# Patient Record
Sex: Female | Born: 1950 | Race: White | Hispanic: No | Marital: Married
Health system: Southern US, Community
[De-identification: ages and names within clinical notes are randomized; demographics above are authoritative.]

---

## 1997-10-23 ENCOUNTER — Other Ambulatory Visit: Admission: RE | Admit: 1997-10-23 | Discharge: 1997-10-23 | Payer: Self-pay | Admitting: Family Medicine

## 2003-01-10 ENCOUNTER — Encounter: Admission: RE | Admit: 2003-01-10 | Discharge: 2003-01-10 | Payer: Self-pay | Admitting: Family Medicine

## 2003-01-10 ENCOUNTER — Encounter: Payer: Self-pay | Admitting: Family Medicine

## 2004-02-06 ENCOUNTER — Encounter: Admission: RE | Admit: 2004-02-06 | Discharge: 2004-02-06 | Payer: Self-pay | Admitting: Family Medicine

## 2004-05-28 ENCOUNTER — Ambulatory Visit (HOSPITAL_COMMUNITY): Admission: RE | Admit: 2004-05-28 | Discharge: 2004-05-29 | Payer: Self-pay | Admitting: Surgery

## 2005-11-03 ENCOUNTER — Emergency Department (HOSPITAL_COMMUNITY): Admission: EM | Admit: 2005-11-03 | Discharge: 2005-11-03 | Payer: Self-pay | Admitting: Emergency Medicine

## 2006-03-23 ENCOUNTER — Other Ambulatory Visit: Admission: RE | Admit: 2006-03-23 | Discharge: 2006-03-23 | Payer: Self-pay | Admitting: Family Medicine

## 2010-12-02 ENCOUNTER — Other Ambulatory Visit: Payer: Self-pay | Admitting: Dermatology

## 2015-12-11 ENCOUNTER — Other Ambulatory Visit (HOSPITAL_COMMUNITY)
Admission: RE | Admit: 2015-12-11 | Discharge: 2015-12-11 | Disposition: A | Discharge status: Home / Self Care | Payer: Medicare Other | Source: Ambulatory Visit | Attending: Family Medicine | Admitting: Family Medicine

## 2015-12-11 ENCOUNTER — Other Ambulatory Visit: Payer: Self-pay | Admitting: Family Medicine

## 2015-12-11 DIAGNOSIS — Z124 Encounter for screening for malignant neoplasm of cervix: Secondary | ICD-10-CM | POA: Insufficient documentation

## 2015-12-12 LAB — CYTOLOGY - PAP

## 2015-12-15 ENCOUNTER — Other Ambulatory Visit: Payer: Self-pay | Admitting: Family Medicine

## 2015-12-15 DIAGNOSIS — Z1231 Encounter for screening mammogram for malignant neoplasm of breast: Secondary | ICD-10-CM

## 2015-12-29 ENCOUNTER — Ambulatory Visit
Admission: RE | Admit: 2015-12-29 | Discharge: 2015-12-29 | Disposition: A | Discharge status: Home / Self Care | Payer: Medicare Other | Source: Ambulatory Visit | Attending: Family Medicine | Admitting: Family Medicine

## 2015-12-29 DIAGNOSIS — Z1231 Encounter for screening mammogram for malignant neoplasm of breast: Secondary | ICD-10-CM

## 2015-12-30 ENCOUNTER — Other Ambulatory Visit: Payer: Self-pay | Admitting: Family Medicine

## 2015-12-30 DIAGNOSIS — R928 Other abnormal and inconclusive findings on diagnostic imaging of breast: Secondary | ICD-10-CM

## 2016-01-07 ENCOUNTER — Ambulatory Visit
Admission: RE | Admit: 2016-01-07 | Discharge: 2016-01-07 | Disposition: A | Discharge status: Home / Self Care | Payer: Medicare Other | Source: Ambulatory Visit | Attending: Family Medicine | Admitting: Family Medicine

## 2016-01-07 DIAGNOSIS — R928 Other abnormal and inconclusive findings on diagnostic imaging of breast: Secondary | ICD-10-CM

## 2017-01-12 ENCOUNTER — Other Ambulatory Visit: Payer: Self-pay | Admitting: Family Medicine

## 2017-01-12 DIAGNOSIS — R921 Mammographic calcification found on diagnostic imaging of breast: Secondary | ICD-10-CM

## 2017-01-21 ENCOUNTER — Ambulatory Visit
Admission: RE | Admit: 2017-01-21 | Discharge: 2017-01-21 | Disposition: A | Discharge status: Home / Self Care | Payer: Medicare Other | Source: Ambulatory Visit | Attending: Family Medicine | Admitting: Family Medicine

## 2017-01-21 DIAGNOSIS — R921 Mammographic calcification found on diagnostic imaging of breast: Secondary | ICD-10-CM

## 2017-12-15 ENCOUNTER — Other Ambulatory Visit: Payer: Self-pay | Admitting: Family Medicine

## 2017-12-15 DIAGNOSIS — Z1231 Encounter for screening mammogram for malignant neoplasm of breast: Secondary | ICD-10-CM

## 2018-01-23 ENCOUNTER — Ambulatory Visit
Admission: RE | Admit: 2018-01-23 | Discharge: 2018-01-23 | Disposition: A | Discharge status: Home / Self Care | Payer: Medicare Other | Source: Ambulatory Visit | Attending: Family Medicine | Admitting: Family Medicine

## 2018-01-23 DIAGNOSIS — Z1231 Encounter for screening mammogram for malignant neoplasm of breast: Secondary | ICD-10-CM

## 2018-01-24 ENCOUNTER — Other Ambulatory Visit: Payer: Self-pay | Admitting: Family Medicine

## 2018-01-24 DIAGNOSIS — R928 Other abnormal and inconclusive findings on diagnostic imaging of breast: Secondary | ICD-10-CM

## 2018-01-31 ENCOUNTER — Ambulatory Visit
Admission: RE | Admit: 2018-01-31 | Discharge: 2018-01-31 | Disposition: A | Discharge status: Home / Self Care | Payer: Medicare Other | Source: Ambulatory Visit | Attending: Family Medicine | Admitting: Family Medicine

## 2018-01-31 DIAGNOSIS — R928 Other abnormal and inconclusive findings on diagnostic imaging of breast: Secondary | ICD-10-CM

## 2019-06-15 ENCOUNTER — Ambulatory Visit: Payer: Medicare Other

## 2019-06-21 ENCOUNTER — Ambulatory Visit: Payer: Medicare PPO | Attending: Internal Medicine

## 2019-06-21 DIAGNOSIS — Z23 Encounter for immunization: Secondary | ICD-10-CM | POA: Insufficient documentation

## 2019-06-21 NOTE — Progress Notes (Signed)
   Covid-19 Vaccination Clinic  Name:  JESICCA DIPIERRO    MRN: [id number] DOB: 1950-06-26  06/21/2019  Ms. Raether was observed post Covid-19 immunization for 15 minutes without incidence. She was provided with Vaccine Information Sheet and instruction to access the V-Safe system.   Ms. Martello was instructed to call 911 with any severe reactions post vaccine: Marland Kitchen Difficulty breathing  . Swelling of your face and throat  . A fast heartbeat  . A bad rash all over your body  . Dizziness and weakness    Immunizations Administered    Name Date Dose VIS Date Route   Pfizer COVID-19 Vaccine 06/21/2019  1:40 PM 0.3 mL 04/27/2019 Intramuscular   Manufacturer: [employer], [employer]   Lot: [id number]   NDC: [address]-9

## 2019-07-02 ENCOUNTER — Ambulatory Visit: Payer: Medicare Other

## 2019-07-16 ENCOUNTER — Ambulatory Visit: Payer: Medicare PPO | Attending: Internal Medicine

## 2019-07-16 DIAGNOSIS — Z23 Encounter for immunization: Secondary | ICD-10-CM | POA: Insufficient documentation

## 2019-07-16 NOTE — Progress Notes (Signed)
   Covid-19 Vaccination Clinic  Name:  Amy Sellers    MRN: [id number] DOB: [birth year]  07/16/2019  Amy Sellers was observed post Covid-19 immunization for 15 minutes without incidence. She was provided with Vaccine Information Sheet and instruction to access the V-Safe system.   Amy Sellers was instructed to call 911 with any severe reactions post vaccine: Marland Kitchen Difficulty breathing  . Swelling of your face and throat  . A fast heartbeat  . A bad rash all over your body  . Dizziness and weakness    Immunizations Administered    Name Date Dose VIS Date Route   Pfizer COVID-19 Vaccine 07/16/2019  4:17 PM 0.3 mL 04/27/2019 Intramuscular   Manufacturer: [employer], [employer]   Lot: [id number]   NDC: [address]-6

## 2019-09-26 IMAGING — MG DIGITAL DIAGNOSTIC UNILATERAL LEFT MAMMOGRAM WITH TOMO AND CAD
6 series · 6 of 18 positions shown · non-contrast
Comparison: Previous exam(s).

CLINICAL DATA: 67-year-old female recalled from screening mammogram
dated 01/23/2018 for a possible left breast asymmetry.

EXAM:
DIGITAL DIAGNOSTIC LEFT MAMMOGRAM WITH CAD AND TOMO
ULTRASOUND LEFT BREAST

[L CC synth-2D (1 of 2)]
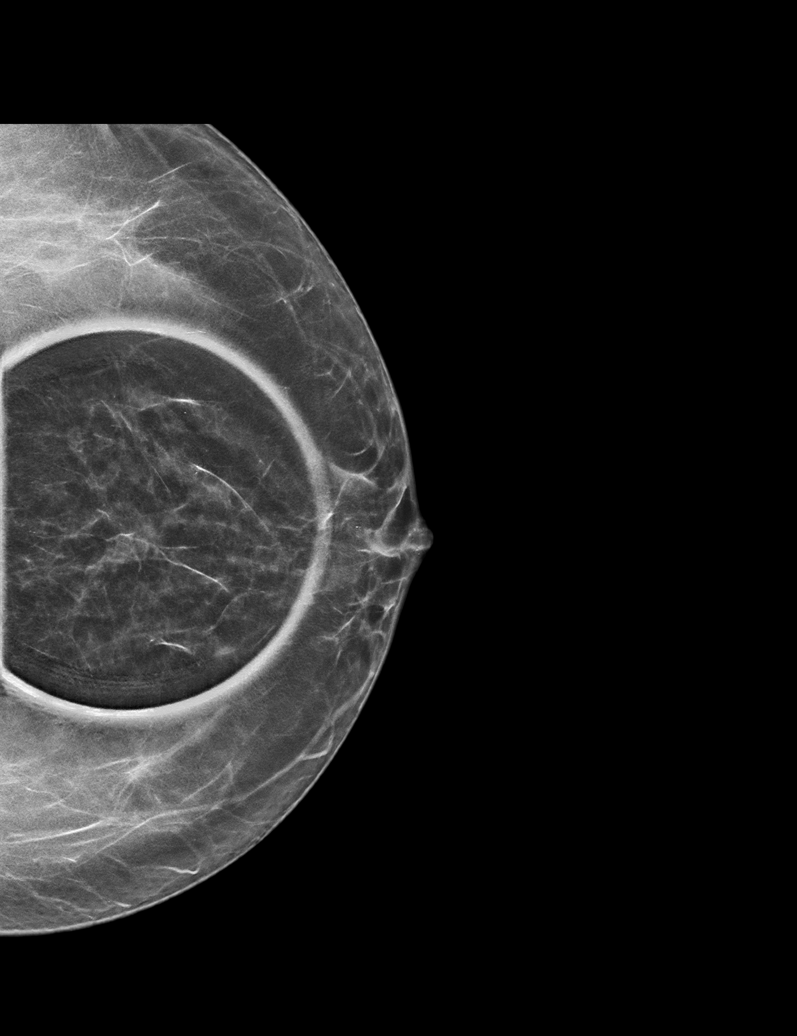

[L MLO synth-2D]
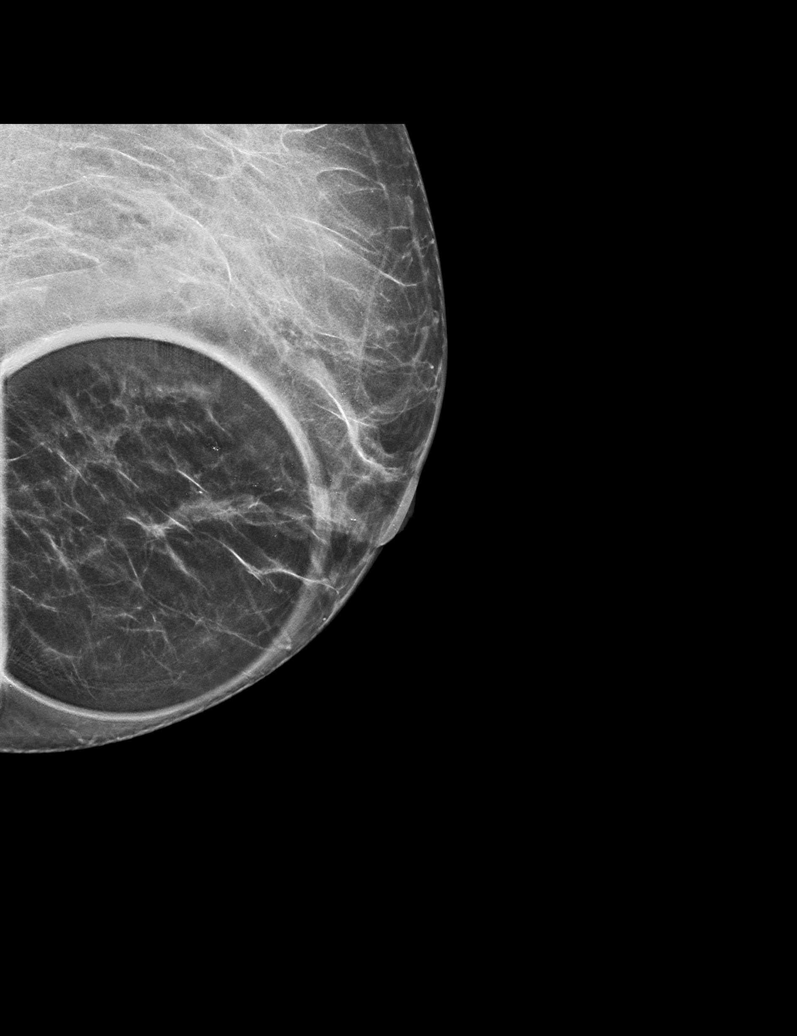

[L CC synth-2D (2 of 2)]
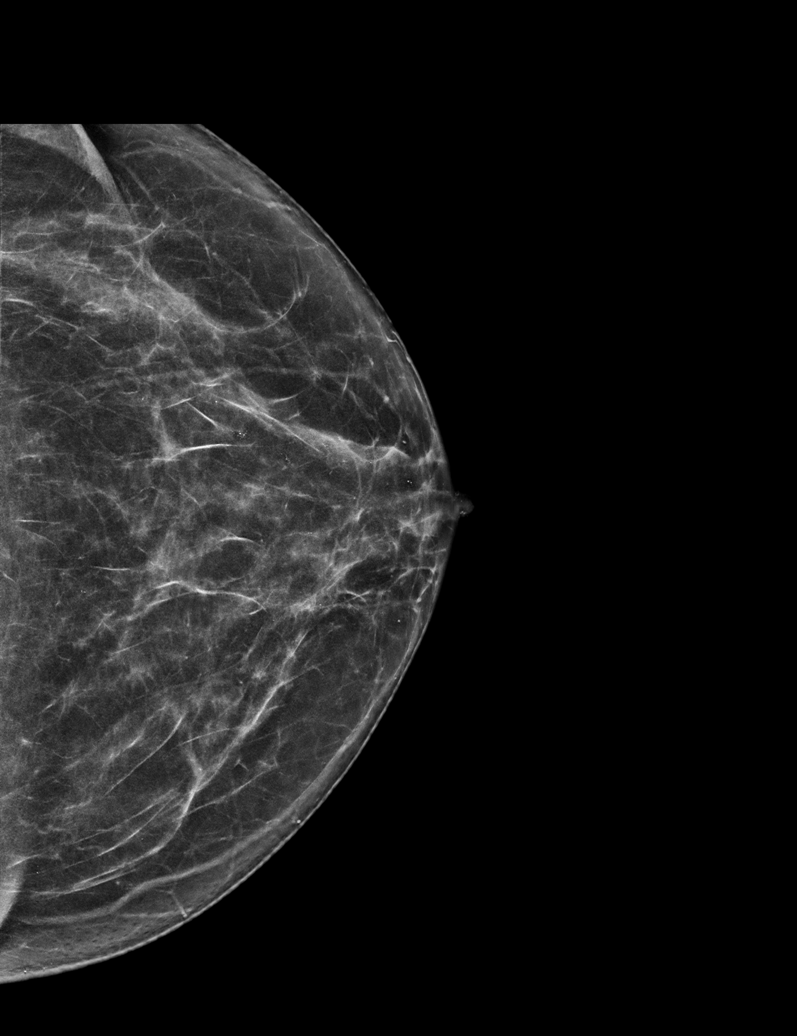

[L CC tomo (1 of 2) · tomo slice 27/53.0]
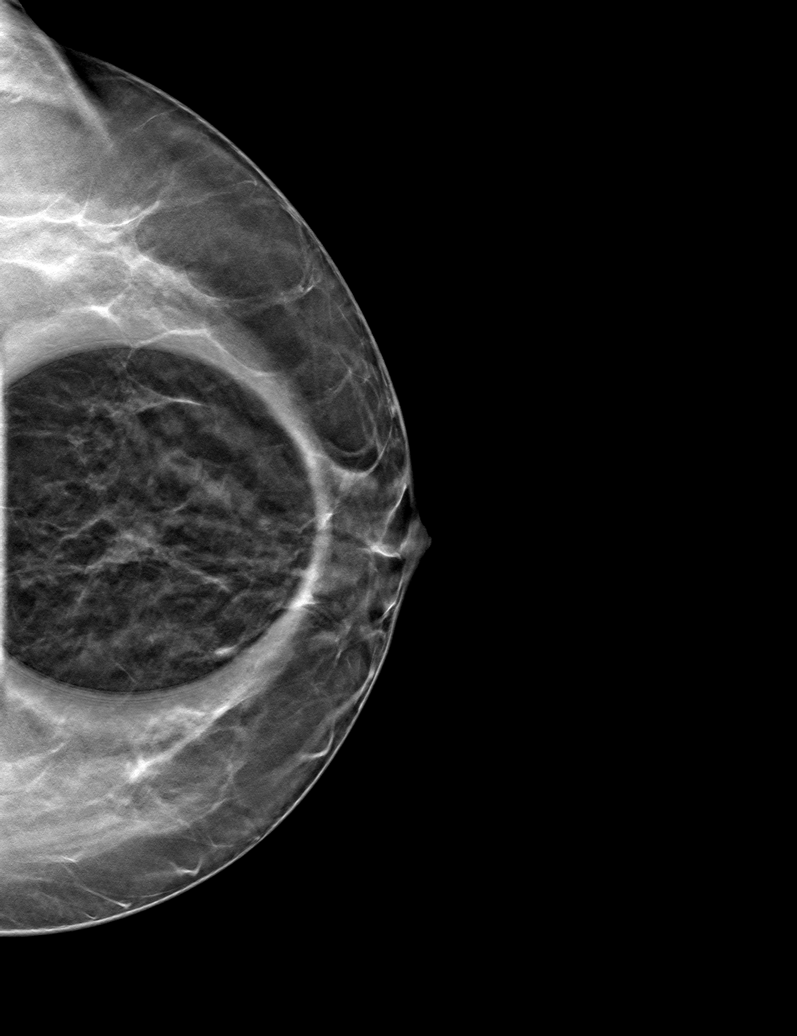

[L CC tomo (2 of 2) · tomo slice 31/62.0]
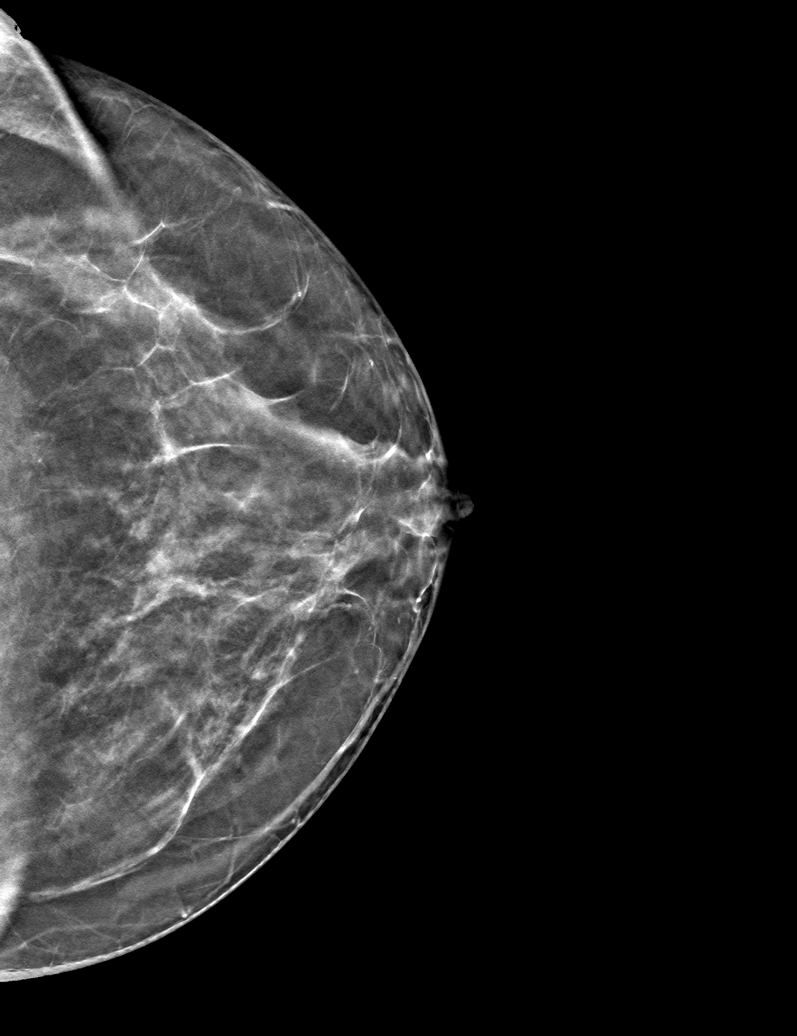

[L MLO tomo · tomo slice 25/48.0]
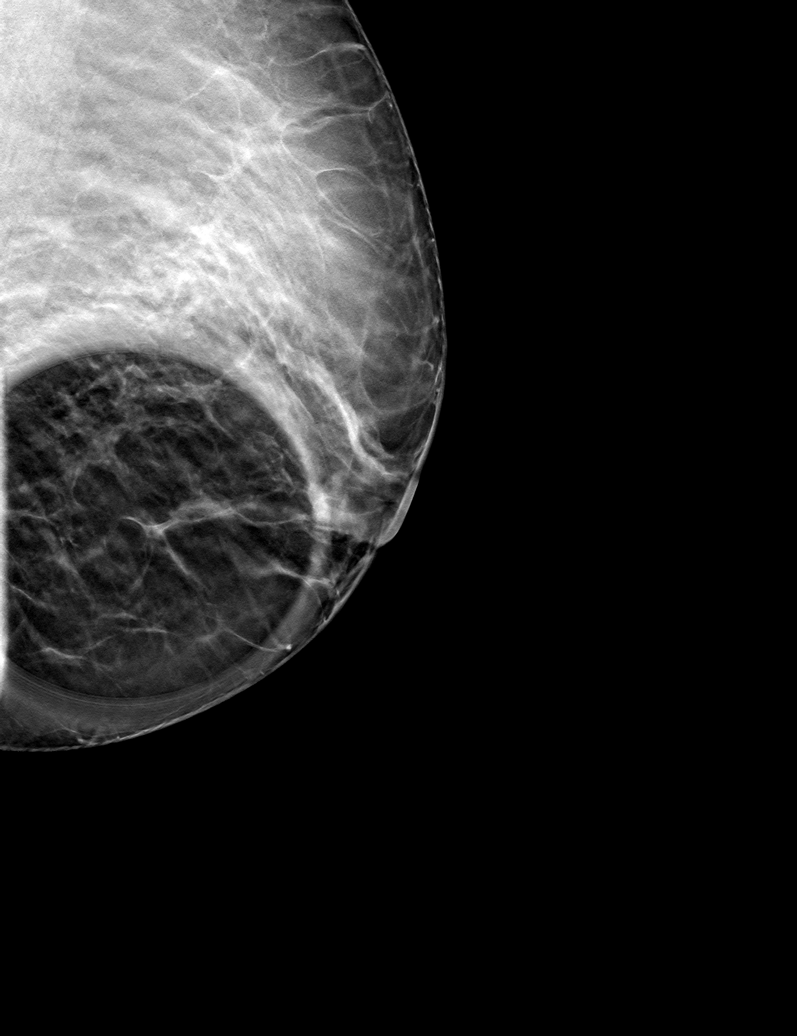

[6 of 18 positions shown; findings below may reference images not displayed]

ACR Breast Density Category c: The breast tissue is heterogeneously
dense, which may obscure small masses.
FINDINGS: Previously described, possible asymmetry in the inferior slightly
medial left breast at middle depth resolves into well dispersed
fatty and fibroglandular tissue on today's additional views. A
precautionary ultrasound was performed.

Mammographic images were processed with CAD.

Targeted ultrasound is performed, showing normal fibroglandular
tissue without focal or suspicious sonographic abnormality.
Evaluation of the entire lower inner quadrant of the left breast was
performed. Numerous minimally dilated ducts are incidentally noted
in the retroareolar region. They demonstrate no suspicious features.
IMPRESSION: No persistent mammographic or sonographic abnormality corresponding
with the screening mammographic finding.

RECOMMENDATION:
Screening mammogram in one year.(Code:MQ-[id number])

I have discussed the findings and recommendations with the patient.
Results were also provided in writing at the conclusion of the
visit. If applicable, a reminder letter will be sent to the patient
regarding the next appointment.

BI-RADS CATEGORY  2: Benign.

## 2020-05-06 DIAGNOSIS — M9903 Segmental and somatic dysfunction of lumbar region: Secondary | ICD-10-CM | POA: Diagnosis not present

## 2020-05-06 DIAGNOSIS — M47816 Spondylosis without myelopathy or radiculopathy, lumbar region: Secondary | ICD-10-CM | POA: Diagnosis not present

## 2020-05-07 DIAGNOSIS — M47816 Spondylosis without myelopathy or radiculopathy, lumbar region: Secondary | ICD-10-CM | POA: Diagnosis not present

## 2020-05-07 DIAGNOSIS — M9903 Segmental and somatic dysfunction of lumbar region: Secondary | ICD-10-CM | POA: Diagnosis not present

## 2021-02-04 DIAGNOSIS — M7662 Achilles tendinitis, left leg: Secondary | ICD-10-CM | POA: Diagnosis not present

## 2021-03-26 DIAGNOSIS — L814 Other melanin hyperpigmentation: Secondary | ICD-10-CM | POA: Diagnosis not present

## 2021-03-26 DIAGNOSIS — L821 Other seborrheic keratosis: Secondary | ICD-10-CM | POA: Diagnosis not present

## 2021-03-26 DIAGNOSIS — L820 Inflamed seborrheic keratosis: Secondary | ICD-10-CM | POA: Diagnosis not present

## 2021-03-26 DIAGNOSIS — D485 Neoplasm of uncertain behavior of skin: Secondary | ICD-10-CM | POA: Diagnosis not present

## 2021-09-11 DIAGNOSIS — M5451 Vertebrogenic low back pain: Secondary | ICD-10-CM | POA: Diagnosis not present

## 2021-09-21 DIAGNOSIS — M47816 Spondylosis without myelopathy or radiculopathy, lumbar region: Secondary | ICD-10-CM | POA: Diagnosis not present

## 2021-11-04 DIAGNOSIS — M47816 Spondylosis without myelopathy or radiculopathy, lumbar region: Secondary | ICD-10-CM | POA: Diagnosis not present

## 2021-11-30 DIAGNOSIS — M5451 Vertebrogenic low back pain: Secondary | ICD-10-CM | POA: Diagnosis not present

## 2021-12-24 DIAGNOSIS — M545 Low back pain, unspecified: Secondary | ICD-10-CM | POA: Diagnosis not present

## 2022-03-01 DIAGNOSIS — M47816 Spondylosis without myelopathy or radiculopathy, lumbar region: Secondary | ICD-10-CM | POA: Diagnosis not present

## 2022-03-25 DIAGNOSIS — M4696 Unspecified inflammatory spondylopathy, lumbar region: Secondary | ICD-10-CM | POA: Diagnosis not present

## 2022-03-25 DIAGNOSIS — M5136 Other intervertebral disc degeneration, lumbar region: Secondary | ICD-10-CM | POA: Diagnosis not present

## 2022-03-25 DIAGNOSIS — M48061 Spinal stenosis, lumbar region without neurogenic claudication: Secondary | ICD-10-CM | POA: Diagnosis not present

## 2022-03-29 DIAGNOSIS — R059 Cough, unspecified: Secondary | ICD-10-CM | POA: Diagnosis not present

## 2022-03-29 DIAGNOSIS — M48061 Spinal stenosis, lumbar region without neurogenic claudication: Secondary | ICD-10-CM | POA: Diagnosis not present

## 2022-03-29 DIAGNOSIS — Z6836 Body mass index (BMI) 36.0-36.9, adult: Secondary | ICD-10-CM | POA: Diagnosis not present

## 2022-03-29 DIAGNOSIS — H6691 Otitis media, unspecified, right ear: Secondary | ICD-10-CM | POA: Diagnosis not present

## 2022-03-29 DIAGNOSIS — M4696 Unspecified inflammatory spondylopathy, lumbar region: Secondary | ICD-10-CM | POA: Diagnosis not present

## 2022-03-29 DIAGNOSIS — M5136 Other intervertebral disc degeneration, lumbar region: Secondary | ICD-10-CM | POA: Diagnosis not present

## 2022-03-29 DIAGNOSIS — R051 Acute cough: Secondary | ICD-10-CM | POA: Diagnosis not present

## 2022-04-06 DIAGNOSIS — M4696 Unspecified inflammatory spondylopathy, lumbar region: Secondary | ICD-10-CM | POA: Diagnosis not present

## 2022-04-06 DIAGNOSIS — M48061 Spinal stenosis, lumbar region without neurogenic claudication: Secondary | ICD-10-CM | POA: Diagnosis not present

## 2022-04-06 DIAGNOSIS — M5136 Other intervertebral disc degeneration, lumbar region: Secondary | ICD-10-CM | POA: Diagnosis not present

## 2022-04-13 DIAGNOSIS — M5136 Other intervertebral disc degeneration, lumbar region: Secondary | ICD-10-CM | POA: Diagnosis not present

## 2022-04-13 DIAGNOSIS — M4696 Unspecified inflammatory spondylopathy, lumbar region: Secondary | ICD-10-CM | POA: Diagnosis not present

## 2022-04-13 DIAGNOSIS — M48061 Spinal stenosis, lumbar region without neurogenic claudication: Secondary | ICD-10-CM | POA: Diagnosis not present

## 2022-04-15 DIAGNOSIS — M4696 Unspecified inflammatory spondylopathy, lumbar region: Secondary | ICD-10-CM | POA: Diagnosis not present

## 2022-04-15 DIAGNOSIS — M48061 Spinal stenosis, lumbar region without neurogenic claudication: Secondary | ICD-10-CM | POA: Diagnosis not present

## 2022-04-15 DIAGNOSIS — M5136 Other intervertebral disc degeneration, lumbar region: Secondary | ICD-10-CM | POA: Diagnosis not present

## 2022-04-19 DIAGNOSIS — M5136 Other intervertebral disc degeneration, lumbar region: Secondary | ICD-10-CM | POA: Diagnosis not present

## 2022-04-19 DIAGNOSIS — M48061 Spinal stenosis, lumbar region without neurogenic claudication: Secondary | ICD-10-CM | POA: Diagnosis not present

## 2022-04-19 DIAGNOSIS — M4696 Unspecified inflammatory spondylopathy, lumbar region: Secondary | ICD-10-CM | POA: Diagnosis not present

## 2022-06-14 DIAGNOSIS — M810 Age-related osteoporosis without current pathological fracture: Secondary | ICD-10-CM | POA: Diagnosis not present

## 2022-06-14 DIAGNOSIS — H6590 Unspecified nonsuppurative otitis media, unspecified ear: Secondary | ICD-10-CM | POA: Diagnosis not present

## 2022-06-14 DIAGNOSIS — E559 Vitamin D deficiency, unspecified: Secondary | ICD-10-CM | POA: Diagnosis not present

## 2022-06-14 DIAGNOSIS — M1990 Unspecified osteoarthritis, unspecified site: Secondary | ICD-10-CM | POA: Diagnosis not present

## 2022-06-14 DIAGNOSIS — D473 Essential (hemorrhagic) thrombocythemia: Secondary | ICD-10-CM | POA: Diagnosis not present

## 2022-06-14 DIAGNOSIS — Z0000 Encounter for general adult medical examination without abnormal findings: Secondary | ICD-10-CM | POA: Diagnosis not present

## 2022-06-14 DIAGNOSIS — E7800 Pure hypercholesterolemia, unspecified: Secondary | ICD-10-CM | POA: Diagnosis not present

## 2022-06-14 DIAGNOSIS — E890 Postprocedural hypothyroidism: Secondary | ICD-10-CM | POA: Diagnosis not present

## 2022-06-14 DIAGNOSIS — Z79899 Other long term (current) drug therapy: Secondary | ICD-10-CM | POA: Diagnosis not present

## 2022-06-15 ENCOUNTER — Other Ambulatory Visit: Payer: Self-pay | Admitting: Family Medicine

## 2022-06-15 DIAGNOSIS — Z1231 Encounter for screening mammogram for malignant neoplasm of breast: Secondary | ICD-10-CM

## 2022-06-29 DIAGNOSIS — H6501 Acute serous otitis media, right ear: Secondary | ICD-10-CM | POA: Diagnosis not present

## 2022-07-28 DIAGNOSIS — M9903 Segmental and somatic dysfunction of lumbar region: Secondary | ICD-10-CM | POA: Diagnosis not present

## 2022-07-28 DIAGNOSIS — M5451 Vertebrogenic low back pain: Secondary | ICD-10-CM | POA: Diagnosis not present

## 2022-07-28 DIAGNOSIS — M9901 Segmental and somatic dysfunction of cervical region: Secondary | ICD-10-CM | POA: Diagnosis not present

## 2022-07-28 DIAGNOSIS — M9902 Segmental and somatic dysfunction of thoracic region: Secondary | ICD-10-CM | POA: Diagnosis not present

## 2022-07-29 DIAGNOSIS — M5451 Vertebrogenic low back pain: Secondary | ICD-10-CM | POA: Diagnosis not present

## 2022-07-29 DIAGNOSIS — M9903 Segmental and somatic dysfunction of lumbar region: Secondary | ICD-10-CM | POA: Diagnosis not present

## 2022-07-29 DIAGNOSIS — M9902 Segmental and somatic dysfunction of thoracic region: Secondary | ICD-10-CM | POA: Diagnosis not present

## 2022-07-29 DIAGNOSIS — M9901 Segmental and somatic dysfunction of cervical region: Secondary | ICD-10-CM | POA: Diagnosis not present

## 2022-08-02 DIAGNOSIS — M9901 Segmental and somatic dysfunction of cervical region: Secondary | ICD-10-CM | POA: Diagnosis not present

## 2022-08-02 DIAGNOSIS — M9902 Segmental and somatic dysfunction of thoracic region: Secondary | ICD-10-CM | POA: Diagnosis not present

## 2022-08-02 DIAGNOSIS — M9903 Segmental and somatic dysfunction of lumbar region: Secondary | ICD-10-CM | POA: Diagnosis not present

## 2022-08-02 DIAGNOSIS — M5451 Vertebrogenic low back pain: Secondary | ICD-10-CM | POA: Diagnosis not present

## 2022-08-04 DIAGNOSIS — M5451 Vertebrogenic low back pain: Secondary | ICD-10-CM | POA: Diagnosis not present

## 2022-08-04 DIAGNOSIS — M9901 Segmental and somatic dysfunction of cervical region: Secondary | ICD-10-CM | POA: Diagnosis not present

## 2022-08-04 DIAGNOSIS — M9903 Segmental and somatic dysfunction of lumbar region: Secondary | ICD-10-CM | POA: Diagnosis not present

## 2022-08-04 DIAGNOSIS — M9902 Segmental and somatic dysfunction of thoracic region: Secondary | ICD-10-CM | POA: Diagnosis not present

## 2022-08-05 DIAGNOSIS — M9901 Segmental and somatic dysfunction of cervical region: Secondary | ICD-10-CM | POA: Diagnosis not present

## 2022-08-05 DIAGNOSIS — M5451 Vertebrogenic low back pain: Secondary | ICD-10-CM | POA: Diagnosis not present

## 2022-08-05 DIAGNOSIS — M9903 Segmental and somatic dysfunction of lumbar region: Secondary | ICD-10-CM | POA: Diagnosis not present

## 2022-08-05 DIAGNOSIS — M9902 Segmental and somatic dysfunction of thoracic region: Secondary | ICD-10-CM | POA: Diagnosis not present

## 2022-08-11 DIAGNOSIS — M9902 Segmental and somatic dysfunction of thoracic region: Secondary | ICD-10-CM | POA: Diagnosis not present

## 2022-08-11 DIAGNOSIS — M5451 Vertebrogenic low back pain: Secondary | ICD-10-CM | POA: Diagnosis not present

## 2022-08-11 DIAGNOSIS — M9903 Segmental and somatic dysfunction of lumbar region: Secondary | ICD-10-CM | POA: Diagnosis not present

## 2022-08-11 DIAGNOSIS — M9901 Segmental and somatic dysfunction of cervical region: Secondary | ICD-10-CM | POA: Diagnosis not present

## 2022-08-31 DIAGNOSIS — Z09 Encounter for follow-up examination after completed treatment for conditions other than malignant neoplasm: Secondary | ICD-10-CM | POA: Diagnosis not present

## 2022-08-31 DIAGNOSIS — D123 Benign neoplasm of transverse colon: Secondary | ICD-10-CM | POA: Diagnosis not present

## 2022-08-31 DIAGNOSIS — Z86010 Personal history of colonic polyps: Secondary | ICD-10-CM | POA: Diagnosis not present

## 2022-08-31 DIAGNOSIS — K5730 Diverticulosis of large intestine without perforation or abscess without bleeding: Secondary | ICD-10-CM | POA: Diagnosis not present

## 2022-09-02 DIAGNOSIS — D123 Benign neoplasm of transverse colon: Secondary | ICD-10-CM | POA: Diagnosis not present

## 2022-09-07 ENCOUNTER — Ambulatory Visit: Payer: Medicare PPO

## 2022-09-30 DIAGNOSIS — H6993 Unspecified Eustachian tube disorder, bilateral: Secondary | ICD-10-CM | POA: Diagnosis not present

## 2022-09-30 DIAGNOSIS — H6991 Unspecified Eustachian tube disorder, right ear: Secondary | ICD-10-CM | POA: Diagnosis not present

## 2022-09-30 DIAGNOSIS — H6521 Chronic serous otitis media, right ear: Secondary | ICD-10-CM | POA: Diagnosis not present

## 2022-09-30 DIAGNOSIS — H90A31 Mixed conductive and sensorineural hearing loss, unilateral, right ear with restricted hearing on the contralateral side: Secondary | ICD-10-CM | POA: Diagnosis not present

## 2022-09-30 DIAGNOSIS — H9011 Conductive hearing loss, unilateral, right ear, with unrestricted hearing on the contralateral side: Secondary | ICD-10-CM | POA: Diagnosis not present

## 2022-10-25 ENCOUNTER — Ambulatory Visit
Admission: RE | Admit: 2022-10-25 | Discharge: 2022-10-25 | Disposition: A | Discharge status: Home / Self Care | Payer: Medicare PPO | Source: Ambulatory Visit | Attending: Family Medicine | Admitting: Family Medicine

## 2022-10-25 DIAGNOSIS — Z1231 Encounter for screening mammogram for malignant neoplasm of breast: Secondary | ICD-10-CM

## 2022-10-28 DIAGNOSIS — H9011 Conductive hearing loss, unilateral, right ear, with unrestricted hearing on the contralateral side: Secondary | ICD-10-CM | POA: Diagnosis not present

## 2022-10-28 DIAGNOSIS — H6991 Unspecified Eustachian tube disorder, right ear: Secondary | ICD-10-CM | POA: Diagnosis not present

## 2022-10-28 DIAGNOSIS — H6521 Chronic serous otitis media, right ear: Secondary | ICD-10-CM | POA: Diagnosis not present

## 2022-12-21 DIAGNOSIS — M9902 Segmental and somatic dysfunction of thoracic region: Secondary | ICD-10-CM | POA: Diagnosis not present

## 2022-12-21 DIAGNOSIS — M9901 Segmental and somatic dysfunction of cervical region: Secondary | ICD-10-CM | POA: Diagnosis not present

## 2022-12-21 DIAGNOSIS — M9903 Segmental and somatic dysfunction of lumbar region: Secondary | ICD-10-CM | POA: Diagnosis not present

## 2022-12-21 DIAGNOSIS — M5451 Vertebrogenic low back pain: Secondary | ICD-10-CM | POA: Diagnosis not present

## 2022-12-29 DIAGNOSIS — M9903 Segmental and somatic dysfunction of lumbar region: Secondary | ICD-10-CM | POA: Diagnosis not present

## 2022-12-29 DIAGNOSIS — M9902 Segmental and somatic dysfunction of thoracic region: Secondary | ICD-10-CM | POA: Diagnosis not present

## 2022-12-29 DIAGNOSIS — M5451 Vertebrogenic low back pain: Secondary | ICD-10-CM | POA: Diagnosis not present

## 2022-12-29 DIAGNOSIS — M9901 Segmental and somatic dysfunction of cervical region: Secondary | ICD-10-CM | POA: Diagnosis not present

## 2023-01-05 DIAGNOSIS — M5451 Vertebrogenic low back pain: Secondary | ICD-10-CM | POA: Diagnosis not present

## 2023-01-05 DIAGNOSIS — M9903 Segmental and somatic dysfunction of lumbar region: Secondary | ICD-10-CM | POA: Diagnosis not present

## 2023-01-05 DIAGNOSIS — M9902 Segmental and somatic dysfunction of thoracic region: Secondary | ICD-10-CM | POA: Diagnosis not present

## 2023-01-05 DIAGNOSIS — M9901 Segmental and somatic dysfunction of cervical region: Secondary | ICD-10-CM | POA: Diagnosis not present

## 2023-01-12 DIAGNOSIS — M9902 Segmental and somatic dysfunction of thoracic region: Secondary | ICD-10-CM | POA: Diagnosis not present

## 2023-01-12 DIAGNOSIS — M9903 Segmental and somatic dysfunction of lumbar region: Secondary | ICD-10-CM | POA: Diagnosis not present

## 2023-01-12 DIAGNOSIS — M5451 Vertebrogenic low back pain: Secondary | ICD-10-CM | POA: Diagnosis not present

## 2023-01-12 DIAGNOSIS — M9901 Segmental and somatic dysfunction of cervical region: Secondary | ICD-10-CM | POA: Diagnosis not present

## 2023-01-26 DIAGNOSIS — M9902 Segmental and somatic dysfunction of thoracic region: Secondary | ICD-10-CM | POA: Diagnosis not present

## 2023-01-26 DIAGNOSIS — M9903 Segmental and somatic dysfunction of lumbar region: Secondary | ICD-10-CM | POA: Diagnosis not present

## 2023-01-26 DIAGNOSIS — M9901 Segmental and somatic dysfunction of cervical region: Secondary | ICD-10-CM | POA: Diagnosis not present

## 2023-01-26 DIAGNOSIS — M5451 Vertebrogenic low back pain: Secondary | ICD-10-CM | POA: Diagnosis not present

## 2023-02-16 DIAGNOSIS — M9902 Segmental and somatic dysfunction of thoracic region: Secondary | ICD-10-CM | POA: Diagnosis not present

## 2023-02-16 DIAGNOSIS — M5451 Vertebrogenic low back pain: Secondary | ICD-10-CM | POA: Diagnosis not present

## 2023-02-16 DIAGNOSIS — M9903 Segmental and somatic dysfunction of lumbar region: Secondary | ICD-10-CM | POA: Diagnosis not present

## 2023-02-16 DIAGNOSIS — M9901 Segmental and somatic dysfunction of cervical region: Secondary | ICD-10-CM | POA: Diagnosis not present

## 2023-03-02 DIAGNOSIS — M9903 Segmental and somatic dysfunction of lumbar region: Secondary | ICD-10-CM | POA: Diagnosis not present

## 2023-03-02 DIAGNOSIS — M5451 Vertebrogenic low back pain: Secondary | ICD-10-CM | POA: Diagnosis not present

## 2023-03-02 DIAGNOSIS — M9902 Segmental and somatic dysfunction of thoracic region: Secondary | ICD-10-CM | POA: Diagnosis not present

## 2023-03-02 DIAGNOSIS — M9901 Segmental and somatic dysfunction of cervical region: Secondary | ICD-10-CM | POA: Diagnosis not present

## 2023-03-30 DIAGNOSIS — M9902 Segmental and somatic dysfunction of thoracic region: Secondary | ICD-10-CM | POA: Diagnosis not present

## 2023-03-30 DIAGNOSIS — M5451 Vertebrogenic low back pain: Secondary | ICD-10-CM | POA: Diagnosis not present

## 2023-03-30 DIAGNOSIS — M9903 Segmental and somatic dysfunction of lumbar region: Secondary | ICD-10-CM | POA: Diagnosis not present

## 2023-03-30 DIAGNOSIS — M9901 Segmental and somatic dysfunction of cervical region: Secondary | ICD-10-CM | POA: Diagnosis not present

## 2023-04-12 DIAGNOSIS — H524 Presbyopia: Secondary | ICD-10-CM | POA: Diagnosis not present

## 2023-04-12 DIAGNOSIS — H2513 Age-related nuclear cataract, bilateral: Secondary | ICD-10-CM | POA: Diagnosis not present

## 2023-08-08 DIAGNOSIS — D473 Essential (hemorrhagic) thrombocythemia: Secondary | ICD-10-CM | POA: Diagnosis not present

## 2023-08-08 DIAGNOSIS — Z79899 Other long term (current) drug therapy: Secondary | ICD-10-CM | POA: Diagnosis not present

## 2023-08-08 DIAGNOSIS — E7800 Pure hypercholesterolemia, unspecified: Secondary | ICD-10-CM | POA: Diagnosis not present

## 2023-08-08 DIAGNOSIS — Z0000 Encounter for general adult medical examination without abnormal findings: Secondary | ICD-10-CM | POA: Diagnosis not present

## 2023-08-08 DIAGNOSIS — E559 Vitamin D deficiency, unspecified: Secondary | ICD-10-CM | POA: Diagnosis not present

## 2023-08-08 DIAGNOSIS — E66812 Obesity, class 2: Secondary | ICD-10-CM | POA: Diagnosis not present

## 2023-08-08 DIAGNOSIS — E890 Postprocedural hypothyroidism: Secondary | ICD-10-CM | POA: Diagnosis not present

## 2023-08-15 DIAGNOSIS — M9903 Segmental and somatic dysfunction of lumbar region: Secondary | ICD-10-CM | POA: Diagnosis not present

## 2023-08-15 DIAGNOSIS — M5451 Vertebrogenic low back pain: Secondary | ICD-10-CM | POA: Diagnosis not present

## 2023-08-15 DIAGNOSIS — M9901 Segmental and somatic dysfunction of cervical region: Secondary | ICD-10-CM | POA: Diagnosis not present

## 2023-08-15 DIAGNOSIS — M9902 Segmental and somatic dysfunction of thoracic region: Secondary | ICD-10-CM | POA: Diagnosis not present

## 2023-08-29 DIAGNOSIS — M9903 Segmental and somatic dysfunction of lumbar region: Secondary | ICD-10-CM | POA: Diagnosis not present

## 2023-08-29 DIAGNOSIS — M5451 Vertebrogenic low back pain: Secondary | ICD-10-CM | POA: Diagnosis not present

## 2023-08-29 DIAGNOSIS — M9902 Segmental and somatic dysfunction of thoracic region: Secondary | ICD-10-CM | POA: Diagnosis not present

## 2023-08-29 DIAGNOSIS — M9901 Segmental and somatic dysfunction of cervical region: Secondary | ICD-10-CM | POA: Diagnosis not present

## 2023-09-06 DIAGNOSIS — M5451 Vertebrogenic low back pain: Secondary | ICD-10-CM | POA: Diagnosis not present

## 2023-09-06 DIAGNOSIS — M9903 Segmental and somatic dysfunction of lumbar region: Secondary | ICD-10-CM | POA: Diagnosis not present

## 2023-09-06 DIAGNOSIS — M9902 Segmental and somatic dysfunction of thoracic region: Secondary | ICD-10-CM | POA: Diagnosis not present

## 2023-09-06 DIAGNOSIS — M9901 Segmental and somatic dysfunction of cervical region: Secondary | ICD-10-CM | POA: Diagnosis not present

## 2023-09-13 DIAGNOSIS — M9902 Segmental and somatic dysfunction of thoracic region: Secondary | ICD-10-CM | POA: Diagnosis not present

## 2023-09-13 DIAGNOSIS — M9901 Segmental and somatic dysfunction of cervical region: Secondary | ICD-10-CM | POA: Diagnosis not present

## 2023-09-13 DIAGNOSIS — M9903 Segmental and somatic dysfunction of lumbar region: Secondary | ICD-10-CM | POA: Diagnosis not present

## 2023-09-13 DIAGNOSIS — M5451 Vertebrogenic low back pain: Secondary | ICD-10-CM | POA: Diagnosis not present

## 2023-09-20 DIAGNOSIS — M5451 Vertebrogenic low back pain: Secondary | ICD-10-CM | POA: Diagnosis not present

## 2023-09-20 DIAGNOSIS — M9901 Segmental and somatic dysfunction of cervical region: Secondary | ICD-10-CM | POA: Diagnosis not present

## 2023-09-20 DIAGNOSIS — M9902 Segmental and somatic dysfunction of thoracic region: Secondary | ICD-10-CM | POA: Diagnosis not present

## 2023-09-20 DIAGNOSIS — M9903 Segmental and somatic dysfunction of lumbar region: Secondary | ICD-10-CM | POA: Diagnosis not present

## 2023-09-28 DIAGNOSIS — M9902 Segmental and somatic dysfunction of thoracic region: Secondary | ICD-10-CM | POA: Diagnosis not present

## 2023-09-28 DIAGNOSIS — M5451 Vertebrogenic low back pain: Secondary | ICD-10-CM | POA: Diagnosis not present

## 2023-09-28 DIAGNOSIS — M9901 Segmental and somatic dysfunction of cervical region: Secondary | ICD-10-CM | POA: Diagnosis not present

## 2023-09-28 DIAGNOSIS — M9903 Segmental and somatic dysfunction of lumbar region: Secondary | ICD-10-CM | POA: Diagnosis not present

## 2023-11-29 DIAGNOSIS — L720 Epidermal cyst: Secondary | ICD-10-CM | POA: Diagnosis not present

## 2023-11-29 DIAGNOSIS — L821 Other seborrheic keratosis: Secondary | ICD-10-CM | POA: Diagnosis not present

## 2023-11-29 DIAGNOSIS — D1723 Benign lipomatous neoplasm of skin and subcutaneous tissue of right leg: Secondary | ICD-10-CM | POA: Diagnosis not present

## 2024-03-22 DIAGNOSIS — D1801 Hemangioma of skin and subcutaneous tissue: Secondary | ICD-10-CM | POA: Diagnosis not present

## 2024-03-22 DIAGNOSIS — D23122 Other benign neoplasm of skin of left lower eyelid, including canthus: Secondary | ICD-10-CM | POA: Diagnosis not present

## 2024-03-22 DIAGNOSIS — D23112 Other benign neoplasm of skin of right lower eyelid, including canthus: Secondary | ICD-10-CM | POA: Diagnosis not present

## 2024-03-22 DIAGNOSIS — L821 Other seborrheic keratosis: Secondary | ICD-10-CM | POA: Diagnosis not present
